# Patient Record
Sex: Female | Born: 1995 | Race: Black or African American | Hispanic: No | Marital: Single | Smoking: Never smoker
Health system: Southern US, Community
[De-identification: ages and names within clinical notes are randomized; demographics above are authoritative.]

---
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
## 2017-02-06 ENCOUNTER — Encounter (HOSPITAL_COMMUNITY): Payer: Self-pay | Admitting: Emergency Medicine

## 2017-02-06 ENCOUNTER — Emergency Department (HOSPITAL_COMMUNITY)
Admission: EM | Admit: 2017-02-06 | Discharge: 2017-02-06 | Disposition: A | Discharge status: Home / Self Care | Payer: Medicaid Other | Attending: Emergency Medicine | Admitting: Emergency Medicine

## 2017-02-06 DIAGNOSIS — R300 Dysuria: Secondary | ICD-10-CM | POA: Diagnosis present

## 2017-02-06 DIAGNOSIS — Z5321 Procedure and treatment not carried out due to patient leaving prior to being seen by health care provider: Secondary | ICD-10-CM | POA: Diagnosis not present

## 2017-02-06 LAB — URINALYSIS, ROUTINE W REFLEX MICROSCOPIC
Bilirubin Urine: NEGATIVE
Glucose, UA: NEGATIVE mg/dL
Hgb urine dipstick: NEGATIVE
Ketones, ur: NEGATIVE mg/dL
LEUKOCYTES UA: NEGATIVE
Nitrite: NEGATIVE
PH: 7 (ref 5.0–8.0)
PROTEIN: NEGATIVE mg/dL
Specific Gravity, Urine: 1.021 (ref 1.005–1.030)

## 2017-02-06 LAB — PREGNANCY, URINE: Preg Test, Ur: NEGATIVE

## 2017-02-06 NOTE — ED Notes (Signed)
Called pt for room 2nd time, no response 

## 2017-02-06 NOTE — ED Notes (Signed)
Called pt, did not respond 

## 2017-02-06 NOTE — ED Triage Notes (Signed)
Pt reports urinary frequency, itching, vaginal odor, and lower midline abd discomfort for the past week. Had similar symptoms 2 months ago and was diagnosed with a UTI; thinks it is the same thing.

## 2017-02-07 ENCOUNTER — Emergency Department (HOSPITAL_COMMUNITY)
Admission: EM | Admit: 2017-02-07 | Discharge: 2017-02-08 | Disposition: A | Discharge status: Home / Self Care | Payer: Medicaid Other | Attending: Emergency Medicine | Admitting: Emergency Medicine

## 2017-02-07 ENCOUNTER — Encounter (HOSPITAL_COMMUNITY): Payer: Self-pay | Admitting: Emergency Medicine

## 2017-02-07 DIAGNOSIS — R1030 Lower abdominal pain, unspecified: Secondary | ICD-10-CM | POA: Insufficient documentation

## 2017-02-07 DIAGNOSIS — N898 Other specified noninflammatory disorders of vagina: Secondary | ICD-10-CM | POA: Diagnosis not present

## 2017-02-07 DIAGNOSIS — Z5321 Procedure and treatment not carried out due to patient leaving prior to being seen by health care provider: Secondary | ICD-10-CM | POA: Insufficient documentation

## 2017-02-07 NOTE — ED Triage Notes (Addendum)
Pt from home with c/o lower abdominal cramping and lower back pain (middle, left and right) that began about 1 week ago. Pt states pain has been persistent. Pt has urinary frequency. Pt also reports white vaginal discharge. Pt checked in for the same 6/30 but left prior to seeing a doctor because she did not want to wait.

## 2017-02-08 NOTE — ED Notes (Signed)
Called for room and no answer 

## 2017-07-09 IMAGING — US US OB EACH ADDL GEST<[ID]
1 series · 14 of 27 positions shown · non-contrast
Comparison: None.

CLINICAL DATA: Recent trauma with low pelvic pain

EXAM:
OBSTETRIC <14 WK ULTRASOUND
TECHNIQUE: Transabdominal ultrasound was performed for evaluation of the
gestation as well as the maternal uterus and adnexal regions.

[Series 1: us ob each addl gest<(id) · 0.14mm/px · 14 of 27 slices shown]
[im 1/27]
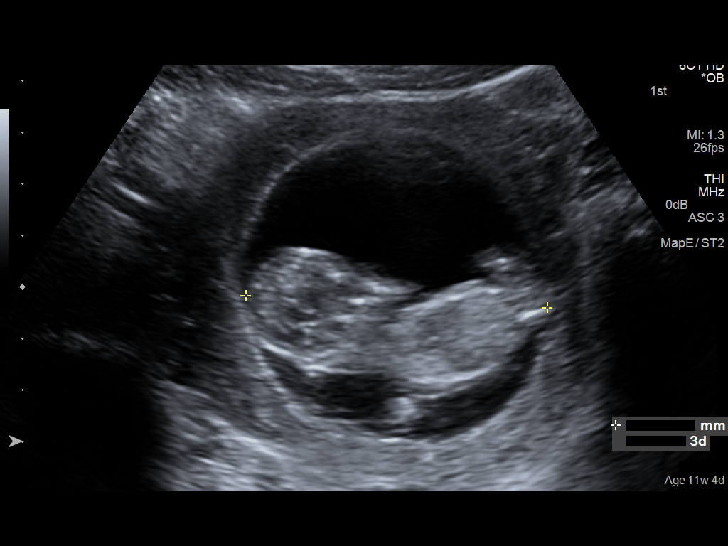
[im 3/27]
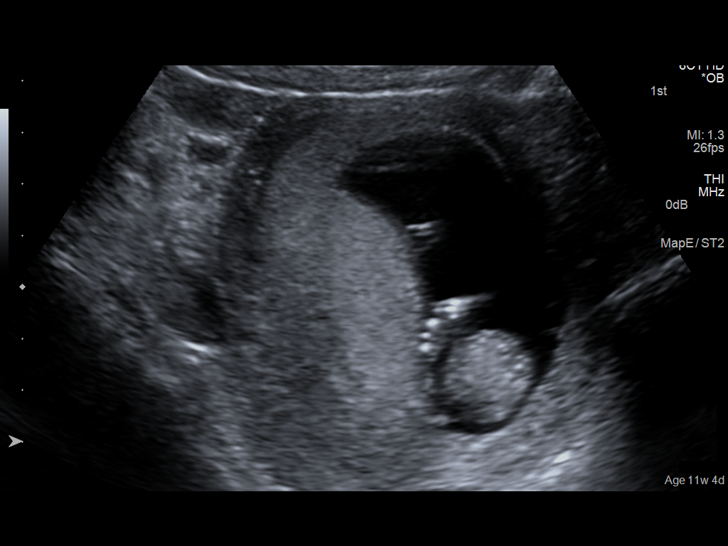
[im 5/27]
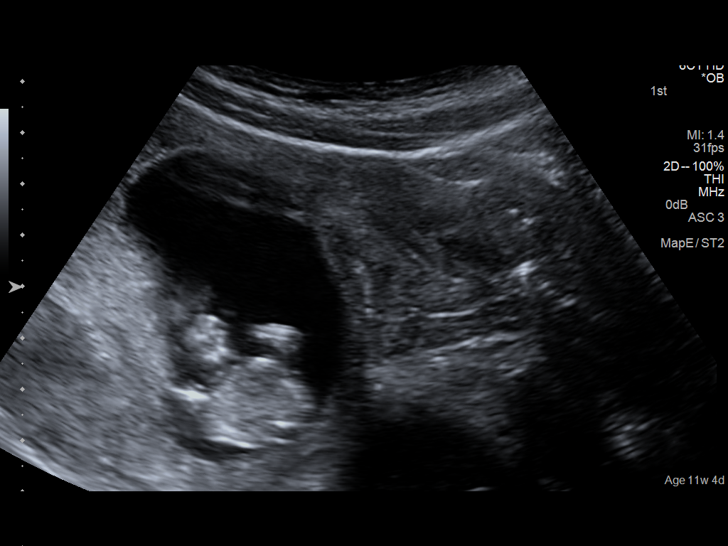
[im 7/27]
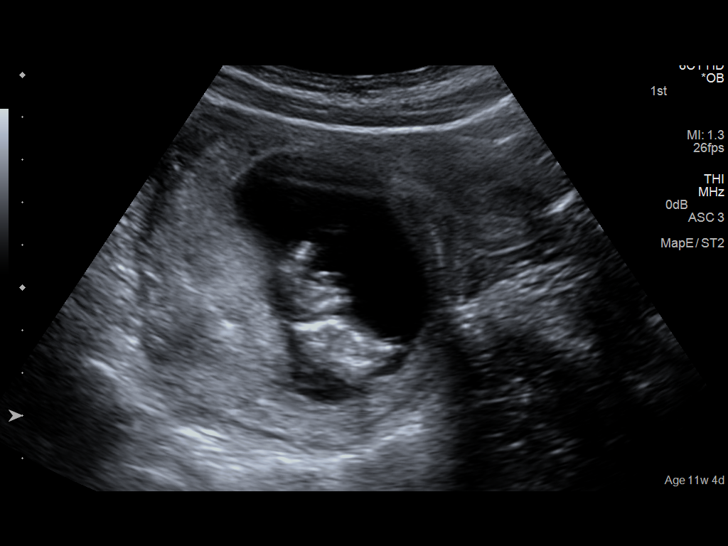
[im 9/27]
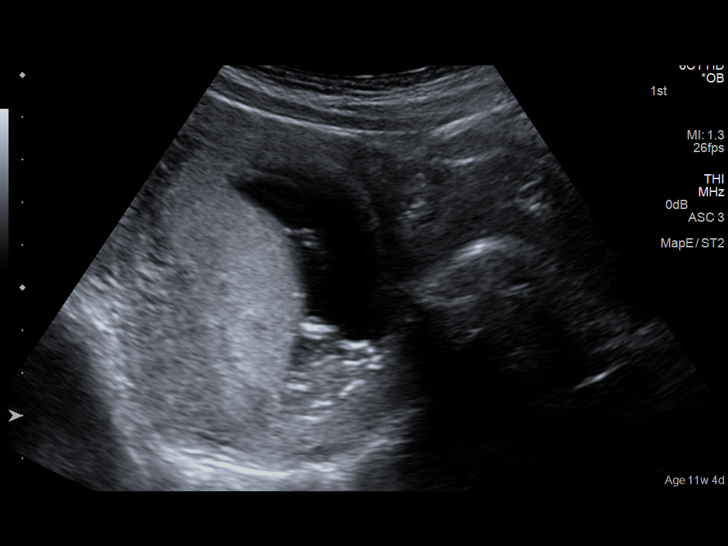
[im 11/27]
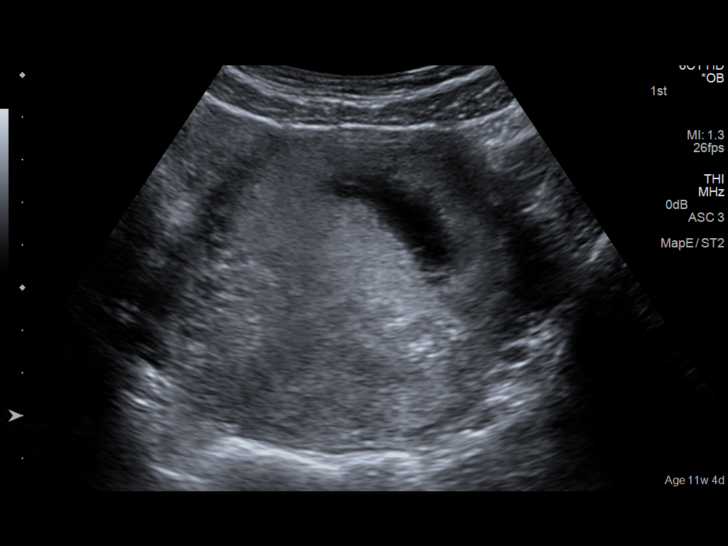
[im 13/27]
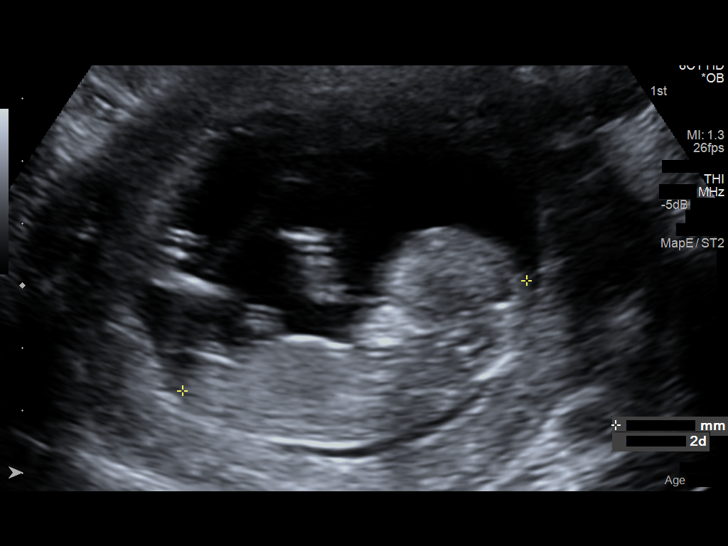
[im 15/27]
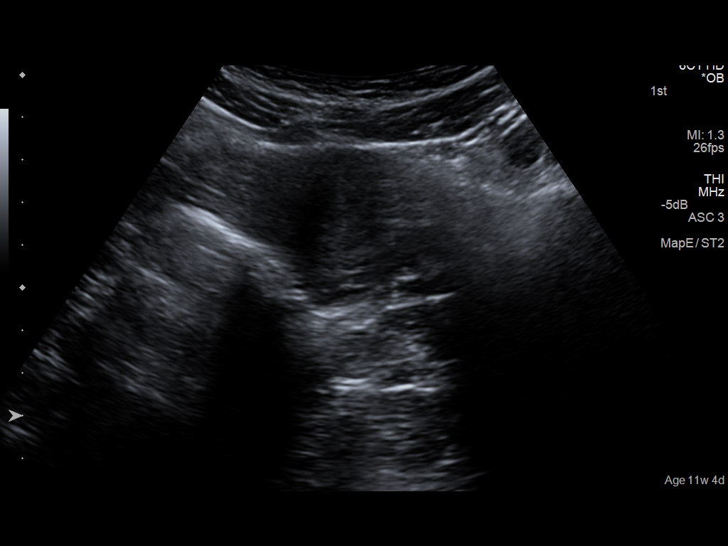
[im 17/27]
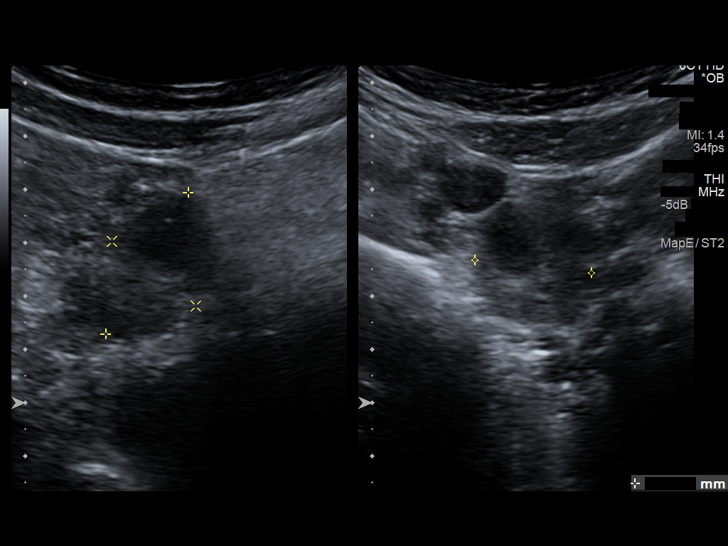
[im 19/27]
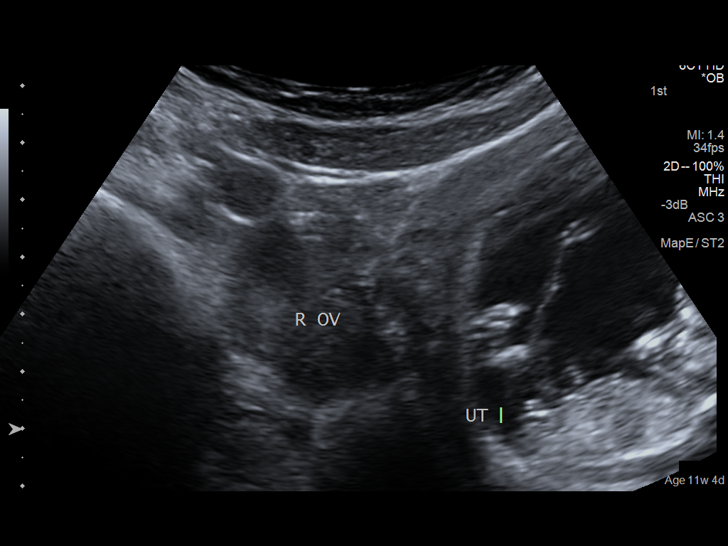
[im 21/27]
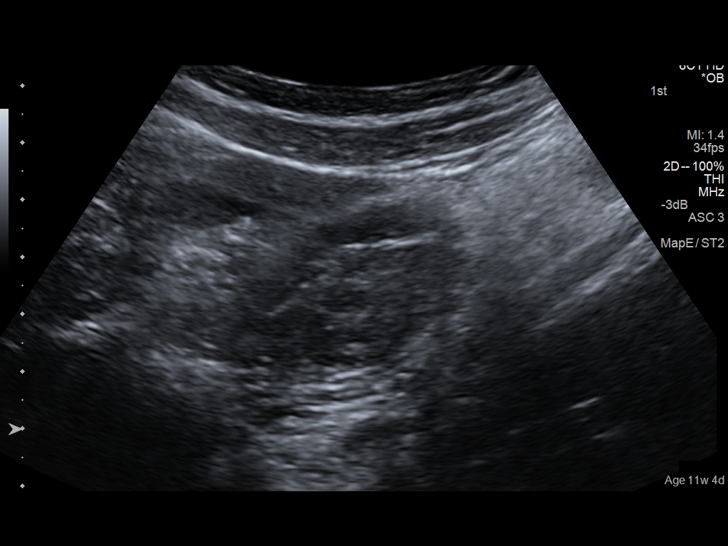
[im 23/27]
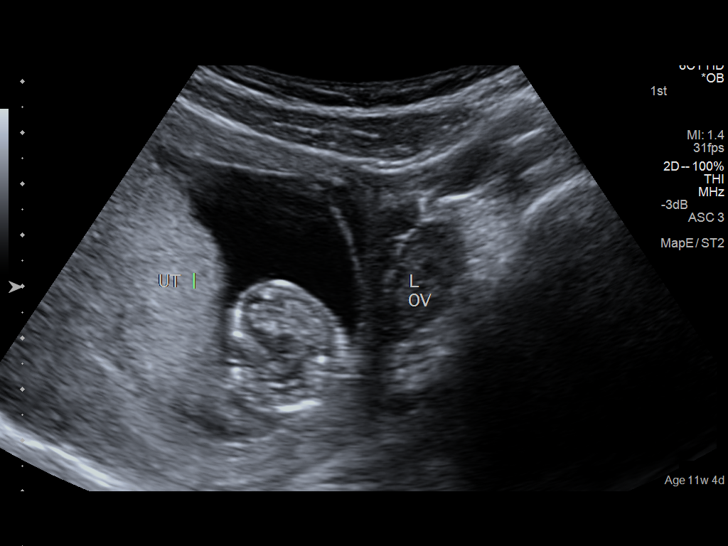
[im 25/27]
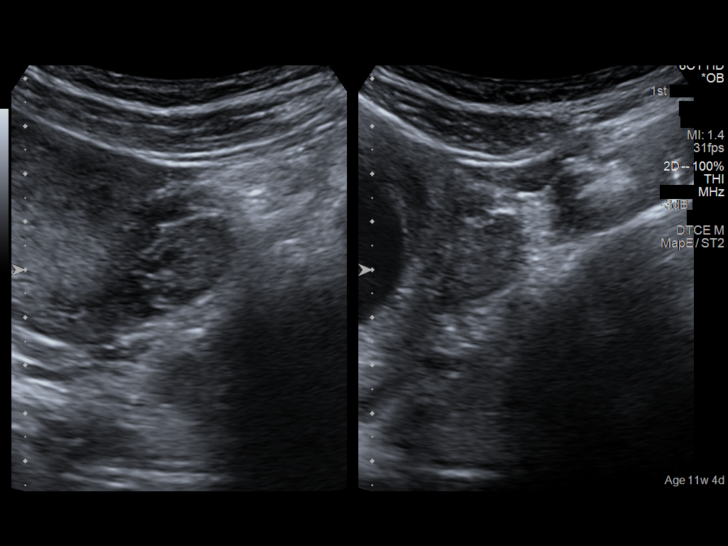
[im 27/27]
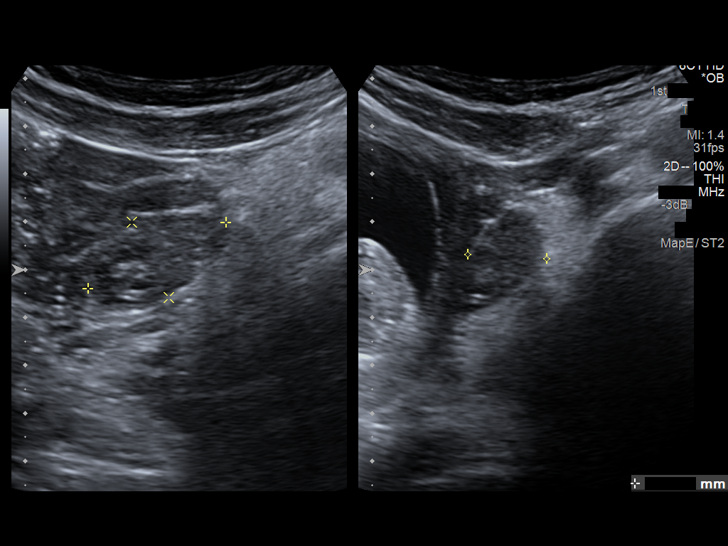

[14 of 27 positions shown; findings below may reference images not displayed]

FINDINGS: Intrauterine gestational sac: Visualized/normal in shape.

Yolk sac:  Absent

Embryo:  Present

Cardiac Activity: Present

Heart Rate: 155 bpm

CRL:   57.9  mm   12 w 2 d                  US EDC: 01/14/2016

Maternal uterus/adnexae: The placenta is posterior in location. No
subchorionic hemorrhage is noted. The ovaries are within normal
limits. The cervix is closed.
IMPRESSION: Single live intrauterine gestation at 12 weeks 2 days. No acute
abnormality is noted.
